# Patient Record
Sex: Female | Born: 1940 | Race: White | Hispanic: No | Smoking: Current every day smoker
Health system: Southern US, Community
[De-identification: ages and names within clinical notes are randomized; demographics above are authoritative.]

## PROBLEM LIST (undated history)

## (undated) DIAGNOSIS — C801 Malignant (primary) neoplasm, unspecified: Secondary | ICD-10-CM

## (undated) DIAGNOSIS — M810 Age-related osteoporosis without current pathological fracture: Secondary | ICD-10-CM

## (undated) DIAGNOSIS — K219 Gastro-esophageal reflux disease without esophagitis: Secondary | ICD-10-CM

## (undated) DIAGNOSIS — F0390 Unspecified dementia without behavioral disturbance: Secondary | ICD-10-CM

## (undated) DIAGNOSIS — I639 Cerebral infarction, unspecified: Secondary | ICD-10-CM

## (undated) HISTORY — PX: OTHER SURGICAL HISTORY: SHX169

## (undated) HISTORY — PX: APPENDECTOMY: SHX54

## (undated) HISTORY — PX: ABDOMINAL HYSTERECTOMY: SHX81

---

## 2013-07-07 ENCOUNTER — Encounter (HOSPITAL_COMMUNITY): Payer: Self-pay | Admitting: Emergency Medicine

## 2013-07-07 ENCOUNTER — Emergency Department (HOSPITAL_COMMUNITY)
Admission: EM | Admit: 2013-07-07 | Discharge: 2013-07-07 | Disposition: A | Discharge status: Home / Self Care | Payer: PRIVATE HEALTH INSURANCE | Attending: Emergency Medicine | Admitting: Emergency Medicine

## 2013-07-07 DIAGNOSIS — Z8719 Personal history of other diseases of the digestive system: Secondary | ICD-10-CM | POA: Insufficient documentation

## 2013-07-07 DIAGNOSIS — R319 Hematuria, unspecified: Secondary | ICD-10-CM | POA: Insufficient documentation

## 2013-07-07 DIAGNOSIS — Z79899 Other long term (current) drug therapy: Secondary | ICD-10-CM | POA: Insufficient documentation

## 2013-07-07 DIAGNOSIS — F0390 Unspecified dementia without behavioral disturbance: Secondary | ICD-10-CM | POA: Insufficient documentation

## 2013-07-07 DIAGNOSIS — F17200 Nicotine dependence, unspecified, uncomplicated: Secondary | ICD-10-CM | POA: Insufficient documentation

## 2013-07-07 DIAGNOSIS — Z8739 Personal history of other diseases of the musculoskeletal system and connective tissue: Secondary | ICD-10-CM | POA: Insufficient documentation

## 2013-07-07 HISTORY — DX: Gastro-esophageal reflux disease without esophagitis: K21.9

## 2013-07-07 HISTORY — DX: Unspecified dementia, unspecified severity, without behavioral disturbance, psychotic disturbance, mood disturbance, and anxiety: F03.90

## 2013-07-07 HISTORY — DX: Age-related osteoporosis without current pathological fracture: M81.0

## 2013-07-07 LAB — CBC
HCT: 37.2 % (ref 36.0–46.0)
HEMOGLOBIN: 12.3 g/dL (ref 12.0–15.0)
MCH: 31.1 pg (ref 26.0–34.0)
MCHC: 33.1 g/dL (ref 30.0–36.0)
MCV: 94.2 fL (ref 78.0–100.0)
Platelets: 310 10*3/uL (ref 150–400)
RBC: 3.95 MIL/uL (ref 3.87–5.11)
RDW: 12.9 % (ref 11.5–15.5)
WBC: 10.8 10*3/uL — ABNORMAL HIGH (ref 4.0–10.5)

## 2013-07-07 LAB — URINALYSIS, ROUTINE W REFLEX MICROSCOPIC
GLUCOSE, UA: 100 mg/dL — AB
LEUKOCYTES UA: NEGATIVE
Nitrite: POSITIVE — AB
Specific Gravity, Urine: >1.03 — ABNORMAL HIGH (ref 1.005–1.030)
UROBILINOGEN UA: 1 mg/dL (ref 0.0–1.0)
pH: 7 (ref 5.0–8.0)

## 2013-07-07 LAB — BASIC METABOLIC PANEL
BUN: 15 mg/dL (ref 6–23)
CO2: 24 mEq/L (ref 19–32)
Calcium: 9.1 mg/dL (ref 8.4–10.5)
Chloride: 102 mEq/L (ref 96–112)
Creatinine, Ser: 0.77 mg/dL (ref 0.50–1.10)
GFR calc non Af Amer: 80 mL/min — ABNORMAL LOW (ref >90)
GLUCOSE: 128 mg/dL — AB (ref 70–99)
POTASSIUM: 3.9 meq/L (ref 3.7–5.3)
SODIUM: 139 meq/L (ref 137–147)

## 2013-07-07 LAB — URINE MICROSCOPIC-ADD ON

## 2013-07-07 MED ORDER — PHENAZOPYRIDINE HCL 200 MG PO TABS: 200.0000 mg | ORAL_TABLET | Freq: Three times a day (TID) | ORAL | Status: DC

## 2013-07-07 NOTE — ED Notes (Signed)
Attempted in and out cath x 2 with no success

## 2013-07-07 NOTE — Discharge Instructions (Signed)
Hematuria, Adult Call Dr. Silvano Rusk office on 07/10/2013 to schedule the next available office appointment. Continue taking the antibiotic prescribed to you yesterday (Tmp-smz). Take the medication prescribed today to help with discomfort when urinating Hematuria is blood in your urine. It can be caused by a bladder infection, kidney infection, prostate infection, kidney stone, or cancer of your urinary tract. Infections can usually be treated with medicine, and a kidney stone usually will pass through your urine. If neither of these is the cause of your hematuria, further workup to find out the reason may be needed. It is very important that you tell your health care provider about any blood you see in your urine, even if the blood stops without treatment or happens without causing pain. Blood in your urine that happens and then stops and then happens again can be a symptom of a very serious condition. Also, pain is not a symptom in the initial stages of many urinary cancers. HOME CARE INSTRUCTIONS   Drink lots of fluid, 3 4 quarts a day. If you have been diagnosed with an infection, cranberry juice is especially recommended, in addition to large amounts of water.  Avoid caffeine, tea, and carbonated beverages, because they tend to irritate the bladder.  Avoid alcohol because it may irritate the prostate.  Only take over-the-counter or prescription medicines for pain, discomfort, or fever as directed by your health care provider.  If you have been diagnosed with a kidney stone, follow your health care provider's instructions regarding straining your urine to catch the stone.  Empty your bladder often. Avoid holding urine for long periods of time.  After a bowel movement, women should cleanse front to back. Use each tissue only once.  Empty your bladder before and after sexual intercourse if you are a female. SEEK MEDICAL CARE IF: You develop back pain, fever, a feeling of sickness in your  stomach (nausea), or vomiting or if your symptoms are not better in 3 days. Return sooner if you are getting worse. SEEK IMMEDIATE MEDICAL CARE IF:   You have a persistent fever, with a temperature of 101.18F (38.8C) or greater.  You develop severe vomiting and are unable to keep the medicine down.  You develop severe back or abdominal pain despite taking your medicines.  You begin passing a large amount of blood or clots in your urine.  You feel extremely weak or faint, or you pass out. MAKE SURE YOU:   Understand these instructions.  Will watch your condition.  Will get help right away if you are not doing well or get worse. Document Released: 02/01/2005 Document Revised: 11/22/2012 Document Reviewed: 10/02/2012 [facility] Patient Information 2014 [place].

## 2013-07-07 NOTE — ED Notes (Signed)
Pt has been being treated for a UTI for 2 months, is passing blood in urine at this time. On Bactrim since yesterday.

## 2013-07-07 NOTE — ED Provider Notes (Signed)
CSN: [id number]     Arrival date & time 07/07/13  1307 History   First MD Initiated Contact with Patient 07/07/13 1343     Chief Complaint  Patient presents with  . Urinary Tract Infection    Level V caveat Dementia. History is obtained from patient's daughter (Consider location/radiation/quality/duration/timing/severity/associated sxs/prior Treatment) HPI Patient is a dysuria and blood mixed with urine for the past 2 months.. She moved here yesterday from [place] to live with her daughter who will be her caretaker. Her daughter states that she's been on multiple courses of antibiotics for urinary tract infections and she's not certain that the patient is taking antibiotics correctly. Patient's primary care physician in [place] recommended that patient have urology followup Past Medical History  Diagnosis Date  . Dementia   . Osteoporosis   . GERD (gastroesophageal reflux disease)    Past Surgical History  Procedure Laterality Date  . Appendectomy    . Abdominal hysterectomy     History reviewed. No pertinent family history. History  Substance Use Topics  . Smoking status: Current Every Day Smoker  . Smokeless tobacco: Not on file  . Alcohol Use: Yes   history of present illness(continued) [place] on urination and urethral meatus OB History   Grav Para Term Preterm Abortions TAB SAB Ect Mult Living                 Review of Systems  Unable to perform ROS: Dementia  Genitourinary: Positive for dysuria and hematuria.      Allergies  Review of patient's allergies indicates no known allergies.  Home Medications   Prior to Admission medications   Medication Sig Start Date End Date Taking? Authorizing Provider  donepezil (ARICEPT) 10 MG tablet Take 10 mg by mouth at bedtime.   Yes Historical Provider, MD  memantine (NAMENDA) 10 MG tablet Take 10 mg by mouth 2 (two) times daily.   Yes Historical Provider, MD   BP 148/62  Pulse 86  Temp(Src) 97.7 F (36.5 C)  (Oral)  Resp 16  Ht 5\' 5"  (1.651 m)  Wt 120 lb (54.432 kg)  BMI 19.97 kg/m2  SpO2 94% Physical Exam  Nursing note and vitals reviewed. Constitutional: She appears well-developed and well-nourished.  HENT:  Head: Normocephalic and atraumatic.  Eyes: Conjunctivae are normal. Pupils are equal, round, and reactive to light.  Neck: Neck supple. No tracheal deviation present. No thyromegaly present.  Cardiovascular: Normal rate and regular rhythm.   No murmur heard. Pulmonary/Chest: Effort normal and breath sounds normal.  Abdominal: Soft. Bowel sounds are normal. She exhibits no distension. There is no tenderness.  Musculoskeletal: Normal range of motion. She exhibits no edema and no tenderness.  Neurological: She is alert. Coordination normal.  Skin: Skin is warm and dry. No rash noted.  Psychiatric: She has a normal mood and affect.   3:10 PM patient resting comfortably. ED Course  Procedures (including critical care time) Labs Review Labs Reviewed - No data to display  Results for orders placed during the hospital encounter of 07/07/13  URINALYSIS, ROUTINE W REFLEX MICROSCOPIC      Result Value Ref Range   Color, Urine RED (*) YELLOW   APPearance CLOUDY (*) CLEAR   Specific Gravity, Urine >1.030 (*) 1.005 - 1.030   pH 7.0  5.0 - 8.0   Glucose, UA 100 (*) NEGATIVE mg/dL   Hgb urine dipstick LARGE (*) NEGATIVE   Bilirubin Urine SMALL (*) NEGATIVE   Ketones, ur TRACE (*) NEGATIVE  mg/dL   Protein, ur >300 (*) NEGATIVE mg/dL   Urobilinogen, UA 1.0  0.0 - 1.0 mg/dL   Nitrite POSITIVE (*) NEGATIVE   Leukocytes, UA NEGATIVE  NEGATIVE  BASIC METABOLIC PANEL      Result Value Ref Range   Sodium 139  137 - 147 mEq/L   Potassium 3.9  3.7 - 5.3 mEq/L   Chloride 102  96 - 112 mEq/L   CO2 24  19 - 32 mEq/L   Glucose, Bld 128 (*) 70 - 99 mg/dL   BUN 15  6 - 23 mg/dL   Creatinine, Ser 0.77  0.50 - 1.10 mg/dL   Calcium 9.1  8.4 - 10.5 mg/dL   GFR calc non Af Amer 80 (*) >90 mL/min    GFR calc Af Amer >90  >90 mL/min  CBC      Result Value Ref Range   WBC 10.8 (*) 4.0 - 10.5 K/uL   RBC 3.95  3.87 - 5.11 MIL/uL   Hemoglobin 12.3  12.0 - 15.0 g/dL   HCT 37.2  36.0 - 46.0 %   MCV 94.2  78.0 - 100.0 fL   MCH 31.1  26.0 - 34.0 pg   MCHC 33.1  30.0 - 36.0 g/dL   RDW 12.9  11.5 - 15.5 %   Platelets 310  150 - 400 K/uL  URINE MICROSCOPIC-ADD ON      Result Value Ref Range   WBC, UA 3-6  <3 WBC/hpf   RBC / HPF TOO NUMEROUS TO COUNT  <3 RBC/hpf   Bacteria, UA MANY (*) RARE   No results found.    Medical Decision Making:patient requests medicine for dysuria.I have explained to patient and daughter need for urologic followup and possiblitity of tumor causing hematuria. Plan continue Bactrim DS. Prescription Pyridium. Referral to Dr. Michela Pitcher. Urine sent for culture Imaging Review No results found.   EKG Interpretation None      MDM   Final diagnoses:  None    diagnosis hematuria     Orlie Dakin, MD 07/07/13 [phone]

## 2013-07-08 LAB — URINE CULTURE
Colony Count: NO GROWTH
Culture: NO GROWTH
SPECIAL REQUESTS: NORMAL

## 2013-07-09 ENCOUNTER — Encounter (HOSPITAL_COMMUNITY): Payer: Self-pay | Admitting: Emergency Medicine

## 2013-07-09 ENCOUNTER — Emergency Department (HOSPITAL_COMMUNITY)
Admission: EM | Admit: 2013-07-09 | Discharge: 2013-07-09 | Disposition: A | Discharge status: Home / Self Care | Payer: PRIVATE HEALTH INSURANCE | Attending: Emergency Medicine | Admitting: Emergency Medicine

## 2013-07-09 ENCOUNTER — Emergency Department (HOSPITAL_COMMUNITY): Payer: PRIVATE HEALTH INSURANCE

## 2013-07-09 DIAGNOSIS — D649 Anemia, unspecified: Secondary | ICD-10-CM | POA: Insufficient documentation

## 2013-07-09 DIAGNOSIS — K219 Gastro-esophageal reflux disease without esophagitis: Secondary | ICD-10-CM | POA: Insufficient documentation

## 2013-07-09 DIAGNOSIS — F0390 Unspecified dementia without behavioral disturbance: Secondary | ICD-10-CM | POA: Insufficient documentation

## 2013-07-09 DIAGNOSIS — Z79899 Other long term (current) drug therapy: Secondary | ICD-10-CM | POA: Insufficient documentation

## 2013-07-09 DIAGNOSIS — M810 Age-related osteoporosis without current pathological fracture: Secondary | ICD-10-CM | POA: Insufficient documentation

## 2013-07-09 DIAGNOSIS — N329 Bladder disorder, unspecified: Secondary | ICD-10-CM | POA: Insufficient documentation

## 2013-07-09 DIAGNOSIS — F17200 Nicotine dependence, unspecified, uncomplicated: Secondary | ICD-10-CM | POA: Insufficient documentation

## 2013-07-09 DIAGNOSIS — N3289 Other specified disorders of bladder: Secondary | ICD-10-CM

## 2013-07-09 DIAGNOSIS — E871 Hypo-osmolality and hyponatremia: Secondary | ICD-10-CM | POA: Insufficient documentation

## 2013-07-09 DIAGNOSIS — R319 Hematuria, unspecified: Secondary | ICD-10-CM | POA: Insufficient documentation

## 2013-07-09 LAB — BASIC METABOLIC PANEL
BUN: 13 mg/dL (ref 6–23)
CHLORIDE: 93 meq/L — AB (ref 96–112)
CO2: 21 meq/L (ref 19–32)
CREATININE: 0.76 mg/dL (ref 0.50–1.10)
Calcium: 8.9 mg/dL (ref 8.4–10.5)
GFR calc Af Amer: >90 mL/min (ref >90)
GFR calc non Af Amer: 80 mL/min — ABNORMAL LOW (ref >90)
Glucose, Bld: 122 mg/dL — ABNORMAL HIGH (ref 70–99)
Potassium: 3.6 mEq/L — ABNORMAL LOW (ref 3.7–5.3)
Sodium: 131 mEq/L — ABNORMAL LOW (ref 137–147)

## 2013-07-09 LAB — CBC WITH DIFFERENTIAL/PLATELET
BASOS ABS: 0 10*3/uL (ref 0.0–0.1)
BASOS PCT: 0 % (ref 0–1)
Eosinophils Absolute: 0.1 10*3/uL (ref 0.0–0.7)
Eosinophils Relative: 1 % (ref 0–5)
HEMATOCRIT: 30.8 % — AB (ref 36.0–46.0)
Hemoglobin: 10.6 g/dL — ABNORMAL LOW (ref 12.0–15.0)
Lymphocytes Relative: 11 % — ABNORMAL LOW (ref 12–46)
Lymphs Abs: 1.2 10*3/uL (ref 0.7–4.0)
MCH: 32.2 pg (ref 26.0–34.0)
MCHC: 34.4 g/dL (ref 30.0–36.0)
MCV: 93.6 fL (ref 78.0–100.0)
Monocytes Absolute: 0.5 10*3/uL (ref 0.1–1.0)
Monocytes Relative: 5 % (ref 3–12)
NEUTROS ABS: 9.2 10*3/uL — AB (ref 1.7–7.7)
NEUTROS PCT: 84 % — AB (ref 43–77)
Platelets: 316 10*3/uL (ref 150–400)
RBC: 3.29 MIL/uL — ABNORMAL LOW (ref 3.87–5.11)
RDW: 12.7 % (ref 11.5–15.5)
WBC: 11 10*3/uL — ABNORMAL HIGH (ref 4.0–10.5)

## 2013-07-09 LAB — URINE MICROSCOPIC-ADD ON

## 2013-07-09 LAB — PROTIME-INR
INR: 1.2 (ref 0.00–1.49)
Prothrombin Time: 14.9 seconds (ref 11.6–15.2)

## 2013-07-09 LAB — APTT: aPTT: 34 seconds (ref 24–37)

## 2013-07-09 MED ORDER — IOHEXOL 300 MG/ML  SOLN
100.0000 mL | Freq: Once | INTRAMUSCULAR | Status: AC | PRN
  Administered 2013-07-09: 100 mL via INTRAVENOUS

## 2013-07-09 MED ORDER — IOHEXOL 300 MG/ML  SOLN
50.0000 mL | Freq: Once | INTRAMUSCULAR | Status: AC | PRN
  Administered 2013-07-09: 50 mL via ORAL

## 2013-07-09 NOTE — ED Provider Notes (Signed)
CSN: [id number]     Arrival date & time 07/09/13  0258 History   First MD Initiated Contact with Patient 07/09/13 [phone]     Chief Complaint  Patient presents with  . Abdominal Pain  . Dysuria     (Consider location/radiation/quality/duration/timing/severity/associated sxs/prior Treatment) HPI Comments: The pt is a 73 y/o female wo takes a baby asa daily - presents with 2nd visit to the ED in 48 hours for difficulty urinating - family member is primary historian secondary to pt's dementia.  She states that over the last few months Ms Cazarez has had hematuria and recurrent UTI's.  She was seen on the 21st at her PMD's office in [place] prior to moving here and told she had clean urine but b/c of the hesitancy that she has had with dysuria, they started bactrim.  She had hematuria all week and when seen 2 days ago had ongoing hematuria (UCx grew no infection).  She has no hx of CA, she has had a hysterectomy.  No fevers at home.  Tonight she awoke her family member telling her that she was unable to urinate at all and has SP pain.  Patient is a 73 y.o. female presenting with dysuria. The history is provided by the patient, a relative and medical records.  Dysuria   Past Medical History  Diagnosis Date  . Dementia   . Osteoporosis   . GERD (gastroesophageal reflux disease)    Past Surgical History  Procedure Laterality Date  . Appendectomy    . Abdominal hysterectomy     No family history on file. History  Substance Use Topics  . Smoking status: Current Every Day Smoker  . Smokeless tobacco: Not on file  . Alcohol Use: Yes   OB History   Grav Para Term Preterm Abortions TAB SAB Ect Mult Living                 Review of Systems  Unable to perform ROS: Dementia  Genitourinary: Positive for dysuria.      Allergies  Review of patient's allergies indicates no known allergies.  Home Medications   Prior to Admission medications   Medication Sig Start Date End Date Taking?  Authorizing Provider  Alendronate Sodium (FOSAMAX PO) Take 1 tablet by mouth once a week.   Yes Historical Provider, MD  Ascorbic Acid 500 MG CHEW Chew 1 tablet by mouth 2 (two) times daily.   Yes Historical Provider, MD  donepezil (ARICEPT) 10 MG tablet Take 10 mg by mouth at bedtime.   Yes Historical Provider, MD  Esomeprazole Magnesium (NEXIUM PO) Take by mouth.   Yes Historical Provider, MD  memantine (NAMENDA) 10 MG tablet Take 10 mg by mouth daily.    Yes Historical Provider, MD  montelukast (SINGULAIR) 10 MG tablet Take 10 mg by mouth daily as needed (allergies).   Yes Historical Provider, MD  sulfamethoxazole-trimethoprim (BACTRIM DS) 800-160 MG per tablet Take 1 tablet by mouth 2 (two) times daily. Started on 07/05/2013 for 10 days   Yes Historical Provider, MD  VITAMIN E PO Take 1 capsule by mouth 3 (three) times daily.   Yes Historical Provider, MD  phenazopyridine (PYRIDIUM) 200 MG tablet Take 1 tablet (200 mg total) by mouth 3 (three) times daily. 07/07/13   Orlie Dakin, MD   BP 142/56  Pulse 69  Temp(Src) 97.8 F (36.6 C) (Oral)  Resp 16  Wt 120 lb (54.432 kg)  SpO2 94% Physical Exam  Nursing note and vitals reviewed. Constitutional:  She appears well-developed and well-nourished. No distress.  HENT:  Head: Normocephalic and atraumatic.  Mouth/Throat: Oropharynx is clear and moist. No oropharyngeal exudate.  Eyes: Conjunctivae and EOM are normal. Pupils are equal, round, and reactive to light. Right eye exhibits no discharge. Left eye exhibits no discharge. No scleral icterus.  Neck: Normal range of motion. Neck supple. No JVD present. No thyromegaly present.  Cardiovascular: Normal rate, regular rhythm, normal heart sounds and intact distal pulses.  Exam reveals no gallop and no friction rub.   No murmur heard. Pulmonary/Chest: Effort normal and breath sounds normal. No respiratory distress. She has no wheezes. She has no rales.  Abdominal: Soft. Bowel sounds are normal.  She exhibits no distension and no mass. There is tenderness (SP ttp and fullness).  Musculoskeletal: Normal range of motion. She exhibits no edema and no tenderness.  Lymphadenopathy:    She has no cervical adenopathy.  Neurological: She is alert. Coordination normal.  Skin: Skin is warm and dry. No rash noted. No erythema.  Psychiatric: She has a normal mood and affect. Her behavior is normal.    ED Course  Procedures (including critical care time) Labs Review Labs Reviewed  BASIC METABOLIC PANEL - Abnormal; Notable for the following:    Sodium 131 (*)    Potassium 3.6 (*)    Chloride 93 (*)    Glucose, Bld 122 (*)    GFR calc non Af Amer 80 (*)    All other components within normal limits  URINE MICROSCOPIC-ADD ON - Abnormal; Notable for the following:    Bacteria, UA MANY (*)    All other components within normal limits  CBC WITH DIFFERENTIAL - Abnormal; Notable for the following:    WBC 11.0 (*)    RBC 3.29 (*)    Hemoglobin 10.6 (*)    HCT 30.8 (*)    Neutrophils Relative % 84 (*)    Neutro Abs 9.2 (*)    Lymphocytes Relative 11 (*)    All other components within normal limits  URINE CULTURE  APTT  PROTIME-INR    Imaging Review Ct Abdomen Pelvis W Contrast  07/09/2013   CLINICAL DATA:  Chronic urinary tract infection, recurrent symptoms. Status post appendectomy.  EXAM: CT ABDOMEN AND PELVIS WITH CONTRAST  TECHNIQUE: Multidetector CT imaging of the abdomen and pelvis was performed using the standard protocol following bolus administration of intravenous contrast.  CONTRAST:  80mL OMNIPAQUE IOHEXOL 300 MG/ML SOLN, 170mL OMNIPAQUE IOHEXOL 300 MG/ML SOLN  COMPARISON:  None.  FINDINGS: Included view of the lung bases demonstrate mild interstitial prominence suggesting scarring without pleural effusions or focal consolidations. . Visualized heart and pericardium are unremarkable.  Mild motion degraded examination.  The spleen, gallbladder, pancreas and adrenal glands are  unremarkable. Patchy non expansile hypodensity in the in the left lobe of the liver likely reflects focal fatty infiltration, the liver is otherwise unremarkable.  The stomach, small and large bowel are normal in course and caliber without inflammatory changes. Diverticulosis. Appendix is not identified, consistent with provided history. No intraperitoneal free fluid nor free air.  Kidneys are orthotopic, demonstrating symmetric enhancement. Moderate right mild left hydroureteronephrosis, with Mild uropathy ileal enhancement. No nephrolithiasis, or renal masses. The unopacified ureters are normal in course and caliber. Delayed imaging through the kidneys demonstrates symmetric prompt excretion to the proximal urinary collecting system. Urinary bladder is well distended, containing a Foley bulb, air, multiple dense masses, including a right mural based at least 4 x 2.6 cm mass.  Aortoiliac vessels are normal in course and caliber with moderate to severe calcific atherosclerosis. Mildly ectatic infrarenal aorta to 2 cm. . No lymphadenopathy by CT size criteria. Internal reproductive organs are unremarkable. The soft tissues and included osseous structures are nonsuspicious. Osteopenia. Grade 1 L5-S1 anterolisthesis with chronic bilateral L5 pars interarticularis defects. Mild chronic appearing L1 compression fracture.  IMPRESSION: Moderate right and mild left hydroureteronephrosis to the level of the urinary bladder, which contains multiple dense masses, some which may reflect hematoma though, partially calcified 4 x 2.6 cm right bladder mass is concerning for neoplasm, recommend cystoscopy.  Diverticulosis without CT findings of acute diverticulitis.   Electronically Signed   By: Elon Alas   On: 07/09/2013 06:21     MDM   Final diagnoses:  Bladder mass  Hematuria  Anemia  Hyponatremia    The pt is pleasantly confused but in obvious mild discomfort in the SP region - likely has urinary retention.   Check renal function, CT to r/o other source of hematuria prior to referral to Urology - and place urinary catheter to empty bladder - will irrigate.  Urinary catheter placed, gross hematuria present, vital signs have remained totally stable without hypotension or tachycardia. The CT scan of the abdomen and pelvis confirmed that the patient has what appears to be a cystic mass which appears calcified and likely has caused some urinary outflow obstruction and/or hematuria. There is some hydroureteronephrosis however at this time the patient's renal function has been preserved, creatinine of 0.7 with a BUN of 13 and a GFR of 80. She has minimal hyponatremia and hypokalemia and her hemoglobin has come down approximately 1.5 g which I suspect is related to the hematuria. The patient's family member states that she will followup with the urologist this week. I have encouraged them to return should there be any increase in the patient's symptoms or blockage of the urinary catheter. They have agreed and are amenable to the plan. At this time I do not think that ongoing antibiotics is appropriate as the hematuria is not likely related to an infectious etiology. Urine culture pending  Meds given in ED:  Medications  iohexol (OMNIPAQUE) 300 MG/ML solution 50 mL (50 mLs Oral Contrast Given 07/09/13 0557)  iohexol (OMNIPAQUE) 300 MG/ML solution 100 mL (100 mLs Intravenous Contrast Given 07/09/13 0557)    New Prescriptions   No medications on file        Johnna Acosta, MD 07/09/13 [phone]

## 2013-07-09 NOTE — ED Notes (Signed)
Leg bag applied to catheter.

## 2013-07-09 NOTE — ED Notes (Signed)
300 cc dark blood w/o clots drained into collection bag. Pt still feels like her bladder is full. Pelvis is firm. Foley irrigated with 30cc NS and returned 500cc easily. No clots noted. Pt feels better. Pelvis soft.

## 2013-07-09 NOTE — ED Notes (Signed)
Pt is being treated for chronic uti's states she is on bactrim but it is not helping, having diff urinating, frequency, urgency

## 2013-07-09 NOTE — Discharge Instructions (Signed)
Your CT scan shows that there is a mass in your bladder. This is likely the source of the blood in your urine. Please see the urologist immediately this week for followup exam and to formulate a plan to further investigate the source of the bleeding.  Please call your doctor for a followup appointment within 24-48 hours. When you talk to your doctor please let them know that you were seen in the emergency department and have them acquire all of your records so that they can discuss the findings with you and formulate a treatment plan to fully care for your new and ongoing problems.  [facility] Primary Care Doctor List    Sinda Du MD. Specialty: Pulmonary Disease Contact information: [address]  [place] [address]  [phone]   Tula Nakayama, MD. Specialty: [facility] Medicine Contact information: [address] [place] [address]  [phone]   Sallee Lange, MD. Specialty: [facility] Medicine Contact information: [place]  [place] [address]  [phone]   Rosita Fire, MD Specialty: Internal Medicine Contact information: [place] [address]  [phone]   Delphina Cahill, MD. Specialty: Internal Medicine Contact information: [place] [address]  [phone]   Marjean Donna, MD. Specialty: Family Medicine Contact information: [place] [address]  [phone]   Leslie Andrea, MD. Specialty: [facility] Medicine Contact information: [place] [address]  [phone]   Asencion Noble, MD. Specialty: Internal Medicine Contact information: [place]  [place] [address]  [phone]

## 2013-07-09 NOTE — ED Notes (Signed)
Drank 1 1/2 bottles of contrast and can't drink any more. CT notified. Pt to go to ct 6am. Pt aware

## 2013-07-09 NOTE — ED Notes (Signed)
Spoke with Elnita Maxwell from [employer]. She reported that the urine sample sent over for UA was unreadable by the machine. A microscopic exam showed the sample was "almost all blood". EDP and pt's RN made aware.

## 2013-07-10 LAB — URINE CULTURE
COLONY COUNT: NO GROWTH
Culture: NO GROWTH
Special Requests: NORMAL

## 2013-07-16 ENCOUNTER — Encounter (HOSPITAL_COMMUNITY): Payer: Self-pay | Admitting: Pharmacy Technician

## 2013-07-19 ENCOUNTER — Emergency Department (HOSPITAL_COMMUNITY)
Admission: EM | Admit: 2013-07-19 | Discharge: 2013-07-19 | Disposition: A | Discharge status: Home / Self Care | Payer: Medicare (Managed Care) | Attending: Emergency Medicine | Admitting: Emergency Medicine

## 2013-07-19 ENCOUNTER — Encounter (HOSPITAL_COMMUNITY): Payer: Self-pay | Admitting: Emergency Medicine

## 2013-07-19 DIAGNOSIS — Z8739 Personal history of other diseases of the musculoskeletal system and connective tissue: Secondary | ICD-10-CM | POA: Insufficient documentation

## 2013-07-19 DIAGNOSIS — N390 Urinary tract infection, site not specified: Secondary | ICD-10-CM | POA: Diagnosis not present

## 2013-07-19 DIAGNOSIS — Z79899 Other long term (current) drug therapy: Secondary | ICD-10-CM | POA: Diagnosis not present

## 2013-07-19 DIAGNOSIS — R062 Wheezing: Secondary | ICD-10-CM | POA: Insufficient documentation

## 2013-07-19 DIAGNOSIS — Z9089 Acquired absence of other organs: Secondary | ICD-10-CM | POA: Diagnosis not present

## 2013-07-19 DIAGNOSIS — F17200 Nicotine dependence, unspecified, uncomplicated: Secondary | ICD-10-CM | POA: Diagnosis not present

## 2013-07-19 DIAGNOSIS — F0390 Unspecified dementia without behavioral disturbance: Secondary | ICD-10-CM | POA: Insufficient documentation

## 2013-07-19 DIAGNOSIS — Z8719 Personal history of other diseases of the digestive system: Secondary | ICD-10-CM | POA: Insufficient documentation

## 2013-07-19 DIAGNOSIS — M7989 Other specified soft tissue disorders: Secondary | ICD-10-CM | POA: Insufficient documentation

## 2013-07-19 DIAGNOSIS — Z90710 Acquired absence of both cervix and uterus: Secondary | ICD-10-CM | POA: Insufficient documentation

## 2013-07-19 DIAGNOSIS — R319 Hematuria, unspecified: Secondary | ICD-10-CM | POA: Diagnosis present

## 2013-07-19 LAB — URINALYSIS, ROUTINE W REFLEX MICROSCOPIC
Glucose, UA: NEGATIVE mg/dL
Ketones, ur: 15 mg/dL — AB
NITRITE: POSITIVE — AB
SPECIFIC GRAVITY, URINE: 1.025 (ref 1.005–1.030)
UROBILINOGEN UA: 2 mg/dL — AB (ref 0.0–1.0)
pH: 7.5 (ref 5.0–8.0)

## 2013-07-19 LAB — CBC WITH DIFFERENTIAL/PLATELET
Basophils Absolute: 0 10*3/uL (ref 0.0–0.1)
Basophils Relative: 0 % (ref 0–1)
Eosinophils Absolute: 0.1 10*3/uL (ref 0.0–0.7)
Eosinophils Relative: 2 % (ref 0–5)
HCT: 29.8 % — ABNORMAL LOW (ref 36.0–46.0)
HEMOGLOBIN: 9.6 g/dL — AB (ref 12.0–15.0)
LYMPHS ABS: 1 10*3/uL (ref 0.7–4.0)
Lymphocytes Relative: 12 % (ref 12–46)
MCH: 31.7 pg (ref 26.0–34.0)
MCHC: 32.2 g/dL (ref 30.0–36.0)
MCV: 98.3 fL (ref 78.0–100.0)
MONOS PCT: 6 % (ref 3–12)
Monocytes Absolute: 0.6 10*3/uL (ref 0.1–1.0)
NEUTROS ABS: 7.3 10*3/uL (ref 1.7–7.7)
NEUTROS PCT: 80 % — AB (ref 43–77)
Platelets: 332 10*3/uL (ref 150–400)
RBC: 3.03 MIL/uL — AB (ref 3.87–5.11)
RDW: 16 % — ABNORMAL HIGH (ref 11.5–15.5)
WBC: 9.1 10*3/uL (ref 4.0–10.5)

## 2013-07-19 LAB — BASIC METABOLIC PANEL
BUN: 16 mg/dL (ref 6–23)
CO2: 25 mEq/L (ref 19–32)
Calcium: 8.8 mg/dL (ref 8.4–10.5)
Chloride: 106 mEq/L (ref 96–112)
Creatinine, Ser: 0.62 mg/dL (ref 0.50–1.10)
GFR calc Af Amer: >90 mL/min (ref >90)
GFR calc non Af Amer: 86 mL/min — ABNORMAL LOW (ref >90)
GLUCOSE: 102 mg/dL — AB (ref 70–99)
POTASSIUM: 3.5 meq/L — AB (ref 3.7–5.3)
Sodium: 144 mEq/L (ref 137–147)

## 2013-07-19 LAB — URINE MICROSCOPIC-ADD ON

## 2013-07-19 MED ORDER — HYDROCODONE-ACETAMINOPHEN 5-325 MG PO TABS: 1.0000 | ORAL_TABLET | Freq: Four times a day (QID) | ORAL | Status: DC | PRN

## 2013-07-19 MED ORDER — OXYCODONE-ACETAMINOPHEN 5-325 MG PO TABS
1.0000 | ORAL_TABLET | Freq: Once | ORAL | Status: AC
  Administered 2013-07-19: 1 via ORAL
  Filled 2013-07-19: qty 1

## 2013-07-19 MED ORDER — CEPHALEXIN 500 MG PO CAPS: 500.0000 mg | ORAL_CAPSULE | Freq: Four times a day (QID) | ORAL | Status: DC

## 2013-07-19 MED ORDER — CEFTRIAXONE SODIUM 1 G IJ SOLR
1.0000 g | Freq: Once | INTRAMUSCULAR | Status: AC
  Administered 2013-07-19: 1 g via INTRAMUSCULAR
  Filled 2013-07-19: qty 10

## 2013-07-19 MED ORDER — LIDOCAINE HCL (PF) 1 % IJ SOLN
INTRAMUSCULAR | Status: AC
  Administered 2013-07-19: 2.1 mL
  Filled 2013-07-19: qty 5

## 2013-07-19 NOTE — Discharge Instructions (Signed)
Follow up with your md next week. °

## 2013-07-19 NOTE — ED Provider Notes (Signed)
CSN: [id number]     Arrival date & time 07/19/13  2005 History   This chart was scribed for Rachel Diego, MD by Martinique Peace, ED Scribe. The patient was seen in [place]. The patient's care was started at 8:49 PM.    Chief Complaint  Patient presents with  . Hematuria      Patient is a 73 y.o. female presenting with abdominal pain. The history is provided by the patient. No language interpreter was used.  Abdominal Pain Pain location:  Suprapubic Pain severity now: Moderate-to-severe. Timing:  Constant Progression:  Worsening Worsened by:  Urination Associated symptoms: hematuria   Associated symptoms: no chest pain, no chills, no cough, no diarrhea, no fatigue, no fever, no nausea and no vomiting    HPI Comments: Crissa Sowder is a 73 y.o. female who presents to the Emergency Department complaining of mild hematuria with associated suprapubic pain that is exacerbated upon unrination. She also reports associated swelling of ankles. She denies fever, chills, nausea or vomiting. She has history of catheter placement with leg bag on May 25th, 2015, that she states has not been functioning properly.    Past Medical History  Diagnosis Date  . Dementia   . Osteoporosis   . GERD (gastroesophageal reflux disease)    Past Surgical History  Procedure Laterality Date  . Appendectomy    . Abdominal hysterectomy     History reviewed. No pertinent family history. History  Substance Use Topics  . Smoking status: Current Every Day Smoker  . Smokeless tobacco: Not on file  . Alcohol Use: Yes   OB History   Grav Para Term Preterm Abortions TAB SAB Ect Mult Living                 Review of Systems  Constitutional: Negative for fever, chills, appetite change and fatigue.  HENT: Negative for congestion, ear discharge and sinus pressure.   Eyes: Negative for discharge.  Respiratory: Negative for cough.   Cardiovascular: Positive for leg swelling (bilateral ankles). Negative for  chest pain.  Gastrointestinal: Positive for abdominal pain. Negative for nausea, vomiting and diarrhea.  Genitourinary: Positive for hematuria. Negative for frequency.  Musculoskeletal: Negative for back pain.  Skin: Negative for rash.  Neurological: Negative for seizures and headaches.  Psychiatric/Behavioral: Negative for hallucinations.      Allergies  Review of patient's allergies indicates no known allergies.  Home Medications   Prior to Admission medications   Medication Sig Start Date End Date Taking? Authorizing Provider  donepezil (ARICEPT) 10 MG tablet Take 10 mg by mouth at bedtime.   Yes Historical Provider, MD  Memantine HCl ER (NAMENDA XR TITRATION PACK) 7 & 14 & 21 &28 MG CP24 Take 14-28 mg by mouth daily. 14 mg until 07/19/13, then 21 mg for 7 days, then 28 mg.   Yes Historical Provider, MD  montelukast (SINGULAIR) 10 MG tablet Take 10 mg by mouth daily as needed (allergies).   Yes Historical Provider, MD   Triage Vitals: BP 137/78  Pulse 85  Temp(Src) 98.1 F (36.7 C) (Oral)  Resp 24  Ht 5\' 5"  (1.651 m)  Wt 120 lb (54.432 kg)  BMI 19.97 kg/m2  SpO2 98%  Physical Exam  Constitutional: She is oriented to person, place, and time. She appears well-developed.  HENT:  Head: Normocephalic.  Eyes: Conjunctivae and EOM are normal. No scleral icterus.  Neck: Neck supple. No thyromegaly present.  Cardiovascular: Normal rate and regular rhythm.  Exam reveals no gallop and  no friction rub.   No murmur heard. Pulmonary/Chest: No stridor. She has wheezes. She has no rales. She exhibits no tenderness.  Wheezing bilaterally.  Abdominal: She exhibits no distension. There is tenderness. There is no rebound.  Mild suprapubic tenderness.  Genitourinary:  Foley cathter in place, not draining  Musculoskeletal: Normal range of motion. She exhibits edema.  1 + edema to bilateral ankles.  Lymphadenopathy:    She has no cervical adenopathy.  Neurological: She is oriented to  person, place, and time. She exhibits normal muscle tone. Coordination normal.  Skin: No rash noted. No erythema.  Psychiatric: She has a normal mood and affect. Her behavior is normal.    ED Course  Procedures (including critical care time) DIAGNOSTIC STUDIES: Oxygen Saturation is 98% on room air, normal by my interpretation.    COORDINATION OF CARE: 8:53 PM- Treatment plan was discussed with patient who verbalizes understanding and agrees.     Labs Review Labs Reviewed  URINALYSIS, ROUTINE W REFLEX MICROSCOPIC  CBC WITH DIFFERENTIAL  BASIC METABOLIC PANEL    Imaging Review No results found.   EKG Interpretation None     Medications - No data to display  MDM   Final diagnoses:  None    uit The chart was scribed for me under my direct supervision.  I personally performed the history, physical, and medical decision making and all procedures in the evaluation of this patient.Rachel Diego, MD 07/19/13 2242

## 2013-07-19 NOTE — ED Notes (Signed)
Pt presents to ED with C/O of foley catheter leaking and burning. Peri care given and catheter irrigated with nacl. Urine sample from catheter obtained. Pt has poor hygiene to indwelling catheter peri care given with incont spray. Pt has a strong smell of urine on a peri pad.

## 2013-07-19 NOTE — ED Notes (Signed)
Indwelling catheter noted to have dark red urine present in leg bag. Bag secured to right leg.

## 2013-07-19 NOTE — ED Notes (Signed)
Pt has catheter  With leg bag,  Since 5/25,  Plans surgery by Dr Michela Pitcher, for ? Bladder cancer.   No N/v.  No fever

## 2013-07-20 ENCOUNTER — Encounter (HOSPITAL_COMMUNITY)
Admission: RE | Admit: 2013-07-20 | Discharge: 2013-07-20 | Disposition: A | Discharge status: Home / Self Care | Payer: PRIVATE HEALTH INSURANCE | Source: Ambulatory Visit | Attending: Urology | Admitting: Urology

## 2013-07-20 ENCOUNTER — Encounter (HOSPITAL_COMMUNITY): Payer: Self-pay

## 2013-07-20 HISTORY — DX: Cerebral infarction, unspecified: I63.9

## 2013-07-20 NOTE — Patient Instructions (Signed)
Rachel Bryant  07/20/2013   Your procedure is scheduled on:   07/26/2013  Report to [facility] at  910  AM.  Call this number if you have problems the morning of surgery: [phone]   Remember:   Do not eat food or drink liquids after midnight.   Take these medicines the morning of surgery with A SIP OF WATER:  Aricept, namenda, singulair   Do not wear jewelry, make-up or nail polish.  Do not wear lotions, powders, or perfumes.   Do not shave 48 hours prior to surgery. Men may shave face and neck.  Do not bring valuables to the hospital.  [name] is not responsible for any belongings or valuables.               Contacts, dentures or bridgework may not be worn into surgery.  Leave suitcase in the car. After surgery it may be brought to your room.  For patients admitted to the hospital, discharge time is determined by your treatment team.               Patients discharged the day of surgery will not be allowed to drive home.  Name and phone number of your driver: family  Special Instructions: Shower using CHG 2 nights before surgery and the night before surgery.  If you shower the day of surgery use CHG.  Use special wash - you have one bottle of CHG for all showers.  You should use approximately 1/3 of the bottle for each shower.   Please read over the following fact sheets that you were given: Pain Booklet, Coughing and Deep Breathing, Surgical Site Infection Prevention, Anesthesia Post-op Instructions and Care and Recovery After Surgery Transurethral Resection, Bladder Tumor A cancerous growth (tumor) can develop on the inside wall of the bladder. The bladder is the organ that holds urine. One way to remove the tumor is a procedure called a transurethral resection. The tumor is removed (resected) through the tube that carries urine from the bladder out of the body (urethra). No cuts (incisions) are made in the skin. Instead, the procedure is done through a thin telescope,  called a resectoscope. Attached to it is a light and usually a tiny camera. The resectoscope is put into the urethra. In men, the urethra opens at the end of the penis. In women, it opens just above the vagina.  A transurethral resection is usually used to remove tumors that have not gotten too big or too deep. These are called Stage 0, Stage 1 or Stage 2 bladder cancers. LET YOUR CAREGIVER KNOW ABOUT:  On the day of the procedure, your caregivers will need to know the last time you had anything to eat or drink. This includes water, gum, and candy. In advance, make sure they know about:   Any allergies.  All medications you are taking, including:  Herbs, eyedrops, over-the-counter medications and creams.  Blood thinners (anticoagulants), aspirin or other drugs that could affect blood clotting.  Use of steroids (by mouth or as creams).  Previous problems with anesthetics, including local anesthetics.  Possibility of pregnancy, if this applies.  Any history of blood clots.  Any history of bleeding or other blood problems.  Previous surgery.  Smoking history.  Any recent symptoms of colds or infections.  Other health problems. RISKS AND COMPLICATIONS This is usually a safe procedure. Every procedure has risks, though. For a transurethral resection, they include:  Infection. Antibiotic medication would need  to be taken.  Bleeding.  Light bleeding may last for several days after the procedure.  If bleeding continues or is heavy, the bladder may need rinsing. Or, a new catheter might be put in for awhile.  Sometimes bed rest is needed.  Urination problems.  Pain and burning can occur when urinating. This usually goes away in a few days.  Scarring from the procedure can block the flow of urine.  Bladder damage.  It can be punctured or torn during removal of the tumor. If this happens, a catheter might be needed for longer. Antibiotics would be taken while the bladder  heals.  Urine can leak through the hole or tear into the abdomen. If this happens, surgery may be needed to repair the bladder. BEFORE THE PROCEDURE   A medical evaluation will be done. This may include:  A physical examination.  Urine test. This is to make sure you do not have a urinary tract infection.  Blood tests.  A test that checks the heart's rhythm (electrocardiogram).  Talking with an anesthesiologist. This is the person who will be in charge of the medication (anesthesia) to keep you from feeling pain during the transurethral resection. You might be asleep during the procedure (general anesthesia) or numb from the waist down, but awake during the procedure (spinal anesthesia). Ask your surgeon what to expect.  The person who is having a transurethral resection needs to give what is called informed consent. This requires signing a legal paper that gives permission for the procedure. To give informed consent:  You must understand how the procedure is done and why.  You must be told all the risks and benefits of the procedure.  You must sign the consent. Sometimes a legal guardian can do this.  Signing should be witnessed by a healthcare professional.  The day before the surgery, eat only a light dinner. Then, do not eat or drink anything for at least 8 hours before the surgery. Ask your caregiver if it is OK to take any needed medicines with a sip of water.  Arrive at least an hour before the surgery or whenever your surgeon recommends. This will give you time to check in and fill out any needed paperwork. PROCEDURE  The preparation:  You will change into a hospital gown.  A needle will be inserted in your arm. This is an intravenous access tube (IV). Medication will be able to flow directly into your body through this needle.  Small monitors will be put on your body. They are used to check your heart, blood pressure, and oxygen level.  You might be given medication  that will help you relax (sedative).  You will be given a general anesthetic or spinal anesthesia.  The procedure:  Once you are asleep or numb from the waist down, your legs will be placed in stirrups.  The resectoscope will be passed through the urethra into the bladder.  Fluid will be passed through the resectoscope. This will fill the bladder with water.  The surgeon will examine the bladder through the scope. If the scope has a camera, it can take pictures from inside the bladder. They can be projected onto a TV screen.  The surgeon will use various tools to remove the tumor in small pieces. Sometimes a laser (a beam of light energy) is used. Other tools may use electric current.  A tube (catheter) will often be placed so that urine can drain into a bag outside the body. This process  helps stop bleeding. This tube keeps blood clots from blocking the urethra.  The procedure usually takes 30 to 45 minutes. AFTER THE PROCEDURE   You will stay in a recovery area until the anesthesia has worn off. Your blood pressure and pulse will be checked every so often. Then you will be taken to a hospital room.  You may continue to get fluids through the IV for awhile.  Some pain is normal. The catheter might be uncomfortable. Pain is usually not severe. If it is, ask for pain medicine.  Your urine may look bloody after a transurethral resection. This is normal.  If bleeding is heavy, a hospital caregiver may rinse out the bladder (irrigation) through the catheter.  Once the urine is clear, the catheter will be taken out.  You will need to stay in the hospital until you can urinate on your own.  Most people stay in the hospital for up to 4 days. PROGNOSIS   Transurethral resection is considered the best way to treat bladder tumors that are not too far along. For most people, the treatment is successful. Sometimes, though, more treatment is needed.  Bladder cancers can come back even  after a successful procedure. Because of this, be sure to have a checkup with your caregiver every 3 to 6 months. If everything is OK for 3 years, you can reduce the checkups to once a year. Document Released: 11/28/2008 Document Revised: 04/26/2011 Document Reviewed: 11/28/2008 [facility] Patient Information 2014 [place]. PATIENT INSTRUCTIONS POST-ANESTHESIA  IMMEDIATELY FOLLOWING SURGERY:  Do not drive or operate machinery for the first twenty four hours after surgery.  Do not make any important decisions for twenty four hours after surgery or while taking narcotic pain medications or sedatives.  If you develop intractable nausea and vomiting or a severe headache please notify your doctor immediately.  FOLLOW-UP:  Please make an appointment with your surgeon as instructed. You do not need to follow up with anesthesia unless specifically instructed to do so.  WOUND CARE INSTRUCTIONS (if applicable):  Keep a dry clean dressing on the anesthesia/puncture wound site if there is drainage.  Once the wound has quit draining you may leave it open to air.  Generally you should leave the bandage intact for twenty four hours unless there is drainage.  If the epidural site drains for more than 36-48 hours please call the anesthesia department.  QUESTIONS?:  Please feel free to call your physician or the hospital operator if you have any questions, and they will be happy to assist you.

## 2013-07-23 NOTE — Pre-Procedure Instructions (Signed)
Dr Patsey Berthold shown labs. Wants patient type and screened.

## 2013-07-24 NOTE — Care Management Note (Signed)
    Page 1 of 1   07/20/2013     2:44:37 PM CARE MANAGEMENT NOTE 07/20/2013  Patient:  Rachel Bryant, Rachel Bryant   Account Number:  [id number]  Date Initiated:  07/20/2013  Documentation initiated by:  Claretha Cooper  Subjective/Objective Assessment:   Called by Short Stay. Pt is here for her presurgical interview and states she needs DME, shower bench prior to surgery.     Action/Plan:   If pt is not inpt or in ED, the pt will need to go to PCP or [employer] to get an order for DME. Advised that shower equipment is normally not covered by insurance and [employer] or Suzie Portela would have these items.   Anticipated DC Date:     Anticipated DC Plan:           Choice offered to / List presented to:             Status of service:   Medicare Important Message given?   (If response is "NO", the following Medicare IM given date fields will be blank) Date Medicare IM given:   Date Additional Medicare IM given:    Discharge Disposition:    Per UR Regulation:    If discussed at Long Length of Stay Meetings, dates discussed:    Comments:  07/20/13 Claretha Cooper RN CM

## 2013-07-26 ENCOUNTER — Encounter (HOSPITAL_COMMUNITY): Admission: RE | Payer: Self-pay | Source: Ambulatory Visit

## 2013-07-26 ENCOUNTER — Ambulatory Visit (HOSPITAL_COMMUNITY): Admission: RE | Admit: 2013-07-26 | Payer: 59 | Source: Ambulatory Visit | Admitting: Urology

## 2013-07-26 SURGERY — TURBT (TRANSURETHRAL RESECTION OF BLADDER TUMOR)
Anesthesia: Choice

## 2013-10-25 ENCOUNTER — Emergency Department (HOSPITAL_COMMUNITY): Payer: Medicare Other

## 2013-10-25 ENCOUNTER — Emergency Department (HOSPITAL_COMMUNITY)
Admission: EM | Admit: 2013-10-25 | Discharge: 2013-10-25 | Disposition: A | Discharge status: Home / Self Care | Payer: Medicare Other | Attending: Emergency Medicine | Admitting: Emergency Medicine

## 2013-10-25 ENCOUNTER — Encounter (HOSPITAL_COMMUNITY): Payer: Self-pay | Admitting: Emergency Medicine

## 2013-10-25 DIAGNOSIS — Z8673 Personal history of transient ischemic attack (TIA), and cerebral infarction without residual deficits: Secondary | ICD-10-CM | POA: Diagnosis not present

## 2013-10-25 DIAGNOSIS — Z9089 Acquired absence of other organs: Secondary | ICD-10-CM | POA: Diagnosis not present

## 2013-10-25 DIAGNOSIS — Z936 Other artificial openings of urinary tract status: Secondary | ICD-10-CM | POA: Diagnosis not present

## 2013-10-25 DIAGNOSIS — R109 Unspecified abdominal pain: Secondary | ICD-10-CM | POA: Diagnosis present

## 2013-10-25 DIAGNOSIS — Z8719 Personal history of other diseases of the digestive system: Secondary | ICD-10-CM | POA: Insufficient documentation

## 2013-10-25 DIAGNOSIS — F17200 Nicotine dependence, unspecified, uncomplicated: Secondary | ICD-10-CM | POA: Diagnosis not present

## 2013-10-25 DIAGNOSIS — F0390 Unspecified dementia without behavioral disturbance: Secondary | ICD-10-CM | POA: Insufficient documentation

## 2013-10-25 DIAGNOSIS — C7919 Secondary malignant neoplasm of other urinary organs: Secondary | ICD-10-CM | POA: Insufficient documentation

## 2013-10-25 DIAGNOSIS — R52 Pain, unspecified: Secondary | ICD-10-CM

## 2013-10-25 DIAGNOSIS — Z792 Long term (current) use of antibiotics: Secondary | ICD-10-CM | POA: Diagnosis not present

## 2013-10-25 DIAGNOSIS — C7911 Secondary malignant neoplasm of bladder: Secondary | ICD-10-CM

## 2013-10-25 DIAGNOSIS — Z90710 Acquired absence of both cervix and uterus: Secondary | ICD-10-CM | POA: Insufficient documentation

## 2013-10-25 DIAGNOSIS — Z8739 Personal history of other diseases of the musculoskeletal system and connective tissue: Secondary | ICD-10-CM | POA: Insufficient documentation

## 2013-10-25 DIAGNOSIS — Z79899 Other long term (current) drug therapy: Secondary | ICD-10-CM | POA: Diagnosis not present

## 2013-10-25 DIAGNOSIS — IMO0002 Reserved for concepts with insufficient information to code with codable children: Secondary | ICD-10-CM

## 2013-10-25 HISTORY — DX: Malignant (primary) neoplasm, unspecified: C80.1

## 2013-10-25 LAB — COMPREHENSIVE METABOLIC PANEL
ALBUMIN: 2.9 g/dL — AB (ref 3.5–5.2)
ALK PHOS: 93 U/L (ref 39–117)
ALT: 20 U/L (ref 0–35)
ANION GAP: 13 (ref 5–15)
AST: 27 U/L (ref 0–37)
BILIRUBIN TOTAL: 0.5 mg/dL (ref 0.3–1.2)
BUN: 21 mg/dL (ref 6–23)
CHLORIDE: 99 meq/L (ref 96–112)
CO2: 25 mEq/L (ref 19–32)
Calcium: 9.2 mg/dL (ref 8.4–10.5)
Creatinine, Ser: 0.61 mg/dL (ref 0.50–1.10)
GFR calc Af Amer: >90 mL/min (ref >90)
GFR calc non Af Amer: 87 mL/min — ABNORMAL LOW (ref >90)
GLUCOSE: 108 mg/dL — AB (ref 70–99)
POTASSIUM: 4.3 meq/L (ref 3.7–5.3)
Sodium: 137 mEq/L (ref 137–147)
Total Protein: 6.6 g/dL (ref 6.0–8.3)

## 2013-10-25 LAB — URINE MICROSCOPIC-ADD ON

## 2013-10-25 LAB — CBC WITH DIFFERENTIAL/PLATELET
BASOS PCT: 0 % (ref 0–1)
Basophils Absolute: 0 10*3/uL (ref 0.0–0.1)
Eosinophils Absolute: 0.1 10*3/uL (ref 0.0–0.7)
Eosinophils Relative: 1 % (ref 0–5)
HCT: 35.1 % — ABNORMAL LOW (ref 36.0–46.0)
Hemoglobin: 11.7 g/dL — ABNORMAL LOW (ref 12.0–15.0)
LYMPHS ABS: 1.2 10*3/uL (ref 0.7–4.0)
Lymphocytes Relative: 9 % — ABNORMAL LOW (ref 12–46)
MCH: 28.5 pg (ref 26.0–34.0)
MCHC: 33.3 g/dL (ref 30.0–36.0)
MCV: 85.6 fL (ref 78.0–100.0)
Monocytes Absolute: 0.7 10*3/uL (ref 0.1–1.0)
Monocytes Relative: 6 % (ref 3–12)
NEUTROS ABS: 11.2 10*3/uL — AB (ref 1.7–7.7)
NEUTROS PCT: 84 % — AB (ref 43–77)
Platelets: 518 10*3/uL — ABNORMAL HIGH (ref 150–400)
RBC: 4.1 MIL/uL (ref 3.87–5.11)
RDW: 16.8 % — ABNORMAL HIGH (ref 11.5–15.5)
WBC: 13.3 10*3/uL — AB (ref 4.0–10.5)

## 2013-10-25 LAB — URINALYSIS, ROUTINE W REFLEX MICROSCOPIC
Bilirubin Urine: NEGATIVE
GLUCOSE, UA: NEGATIVE mg/dL
Ketones, ur: NEGATIVE mg/dL
Nitrite: NEGATIVE
PH: 7 (ref 5.0–8.0)
Protein, ur: 100 mg/dL — AB
Specific Gravity, Urine: 1.01 (ref 1.005–1.030)
Urobilinogen, UA: 0.2 mg/dL (ref 0.0–1.0)

## 2013-10-25 LAB — PROTIME-INR
INR: 1.09 (ref 0.00–1.49)
Prothrombin Time: 14.1 seconds (ref 11.6–15.2)

## 2013-10-25 MED ORDER — OXYCODONE HCL ER 10 MG PO T12A
20.0000 mg | EXTENDED_RELEASE_TABLET | Freq: Two times a day (BID) | ORAL | Status: DC
  Administered 2013-10-25: 20 mg via ORAL
  Filled 2013-10-25: qty 2

## 2013-10-25 MED ORDER — DOCUSATE SODIUM 100 MG PO CAPS: 100.0000 mg | ORAL_CAPSULE | Freq: Two times a day (BID) | ORAL | Status: DC

## 2013-10-25 MED ORDER — CEPHALEXIN 500 MG PO CAPS: 500.0000 mg | ORAL_CAPSULE | Freq: Four times a day (QID) | ORAL | Status: AC

## 2013-10-25 MED ORDER — CEPHALEXIN 500 MG PO CAPS
500.0000 mg | ORAL_CAPSULE | Freq: Once | ORAL | Status: AC
  Administered 2013-10-25: 500 mg via ORAL
  Filled 2013-10-25: qty 1

## 2013-10-25 MED ORDER — OXYCODONE HCL ER 15 MG PO T12A: 15.0000 mg | EXTENDED_RELEASE_TABLET | Freq: Two times a day (BID) | ORAL | Status: DC

## 2013-10-25 NOTE — ED Provider Notes (Addendum)
CSN: [id number]     Arrival date & time 10/25/13  1215 History  This chart was scribed for Tanna Furry, MD by Ludger Nutting, ED Scribe. This patient was seen in room APA07/APA07 and the patient's care was started 1:34 PM.    Chief Complaint  Patient presents with  . Abdominal Pain    The history is provided by the patient. No language interpreter was used.   LEVEL 5 CAVEAT - Dementia  HPI Comments: Rachel Bryant is a 73 y.o. female with past medical history of dementia, CA brought in by ambulance, who presents to the Emergency Department but unable to provide a chief complaint. Patient was discharged from [facility] for a right nephrostomy tube placement about 4-5 days ago. Patient is unable to state where she has been living since she was released from the hospital. She denies fever, nausea, vomiting, muscular pain, SOB.   Past Medical History  Diagnosis Date  . Osteoporosis   . GERD (gastroesophageal reflux disease)   . Dementia   . Stroke unsure  . Cancer    Past Surgical History  Procedure Laterality Date  . Appendectomy    . Abdominal hysterectomy    . Nephrostomy tube     History reviewed. No pertinent family history. History  Substance Use Topics  . Smoking status: Current Every Day Smoker  . Smokeless tobacco: Not on file  . Alcohol Use: No   OB History   Grav Para Term Preterm Abortions TAB SAB Ect Mult Living                 Review of Systems  Unable to perform ROS: Dementia      Allergies  Review of patient's allergies indicates no known allergies.  Home Medications   Prior to Admission medications   Medication Sig Start Date End Date Taking? Authorizing Provider  donepezil (ARICEPT) 10 MG tablet Take 10 mg by mouth at bedtime.   Yes Historical Provider, MD  Memantine HCl ER (NAMENDA XR) 28 MG CP24 Take 28 mg by mouth daily.   Yes Historical Provider, MD  ondansetron (ZOFRAN) 4 MG tablet Take 4 mg by mouth every 8 (eight) hours as needed for nausea or  vomiting.   Yes Historical Provider, MD  oxybutynin (DITROPAN-XL) 10 MG 24 hr tablet Take 10 mg by mouth at bedtime.   Yes Historical Provider, MD  oxyCODONE (OXY IR/ROXICODONE) 5 MG immediate release tablet Take 5 mg by mouth every 4 (four) hours as needed for severe pain (pain).   Yes Historical Provider, MD  cephALEXin (KEFLEX) 500 MG capsule Take 1 capsule (500 mg total) by mouth 4 (four) times daily. 10/25/13   Tanna Furry, MD   BP 154/62  Pulse 79  Temp(Src) 97.8 F (36.6 C) (Oral)  Resp 18  Wt 120 lb (54.432 kg)  SpO2 96% Physical Exam  Nursing note and vitals reviewed. Constitutional: She is oriented to person, place, and time. She appears well-developed and well-nourished. No distress.  HENT:  Head: Normocephalic.  Eyes: Conjunctivae are normal. Pupils are equal, round, and reactive to light. No scleral icterus.  Neck: Normal range of motion. Neck supple. No thyromegaly present.  Cardiovascular: Normal rate, regular rhythm and normal heart sounds.  Exam reveals no gallop and no friction rub.   No murmur heard. Pulmonary/Chest: Effort normal and breath sounds normal. No respiratory distress. She has no wheezes. She has no rales.  Abdominal: Soft. Bowel sounds are normal. She exhibits no distension. There is no tenderness.  There is no rebound.  Genitourinary:  Foley bag with orange colored urine  Musculoskeletal: Normal range of motion.  Neurological: She is alert and oriented to person, place, and time.  Dementia   Skin: Skin is warm and dry. No rash noted.  Psychiatric: She has a normal mood and affect. Her behavior is normal.    ED Course  Procedures (including critical care time)  DIAGNOSTIC STUDIES: Oxygen Saturation is 96% on RA, adequate by my interpretation.    COORDINATION OF CARE: 1:38 PM Discussed treatment plan with pt at bedside and pt agreed to plan.   Labs Review Labs Reviewed  CBC WITH DIFFERENTIAL - Abnormal; Notable for the following:    WBC 13.3 (*)     Hemoglobin 11.7 (*)    HCT 35.1 (*)    RDW 16.8 (*)    Platelets 518 (*)    Neutrophils Relative % 84 (*)    Neutro Abs 11.2 (*)    Lymphocytes Relative 9 (*)    All other components within normal limits  COMPREHENSIVE METABOLIC PANEL - Abnormal; Notable for the following:    Glucose, Bld 108 (*)    Albumin 2.9 (*)    GFR calc non Af Amer 87 (*)    All other components within normal limits  URINALYSIS, ROUTINE W REFLEX MICROSCOPIC - Abnormal; Notable for the following:    Hgb urine dipstick LARGE (*)    Protein, ur 100 (*)    Leukocytes, UA SMALL (*)    All other components within normal limits  URINE MICROSCOPIC-ADD ON - Abnormal; Notable for the following:    Squamous Epithelial / LPF FEW (*)    Bacteria, UA FEW (*)    All other components within normal limits  URINE CULTURE  PROTIME-INR    Imaging Review No results found.   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 I was also able to access her records and daughter brings with her. 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 She is getting home health care nursing. Daughter reported to them that the patient cries with pain at night. The daughter had already set up a consult with hospice for this afternoon. The home health care nurse apparently called 911 to have the patient brought to the hospital for "pain management".  Patient has no complaints  of pain here. Has a leukocytosis of 13,000 and does have cells in her urine. This is not surprising considering the presence of a nephrostomy tube. The nephrostomy tube site appears quite intact, and without sign of local infection drainage or erythema. Urine has been cultured. I think the patient is appropriate for discharge home to continue her home health care needs. We will place her on Keflex until urine culture result is available. She was given a single dose of 15 mg of OxyContin here for pain control tonight. He'll be reevaluated at home tonight, and in the morning  by hospice at home.   Tanna Furry, MD 10/25/13 [place], MD 10/25/13 [place], MD 11/09/13 [phone]

## 2013-10-25 NOTE — ED Notes (Signed)
[facility] called for patient discharge transfer.

## 2013-10-25 NOTE — Discharge Instructions (Signed)
Continue her pain medicine regimen at home. Hospice will evaluate patient for further pain control needs. Continue home health care nursing.  Bladder Cancer Bladder cancer is an abnormal growth of tissue in your bladder. Your bladder is the balloon-like sac in your pelvis. It collects and stores urine that comes from the kidneys through the ureters. The bladder wall is made of layers. If cancer spreads into these layers and through the wall of the bladder, it becomes more difficult to treat.  There are four stages of bladder cancer:  Stage I. Cancer at this stage occurs in the bladder's inner lining but has not invaded the muscular bladder wall.  Stage II. At this stage, cancer has invaded the bladder wall but is still confined to the bladder.  Stage III. By this stage, the cancer cells have spread through the bladder wall to surrounding tissue. They may also have spread to the prostate in men or the uterus or vagina in women.  Stage IV. By this stage, cancer cells may have spread to the lymph nodes and other organs, such as your lungs, bones, or liver. RISK FACTORS Although the cause of bladder cancer is not known, the following risk factors can increase your chances of getting bladder cancer:   Smoking.   Occupational exposures, such as rubber, leather, textile, dyes, chemicals, and paint.  Being white.  Age.   Being female.   Having chronic bladder inflammation.   Having a bladder cancer history.   Having a family history of bladder cancer (heredity).   Having had chemotherapy or radiation therapy to the pelvis.   Being exposed to arsenic.  SYMPTOMS   Blood in the urine.   Pain with urination.   Frequent bladder or urine infections.  Increase in urgency and frequency of urination. DIAGNOSIS  Your health care provider may suspect bladder cancer based on your description of urinary symptoms or based on the finding of blood or infection in the urine  (especially if this has recurred several times). Other tests or procedures that may be performed include:   A narrow tube being inserted into your bladder through your urethra (cystoscopy) in order to view the lining of your bladder for tumors.   A biopsy to sample the tumor to see if cancer is present.  If cancer is present, it will then be staged to determine its severity and extent. It is important to know how deeply into the bladder wall the cancer has grown and whether the cancer has spread to any other parts of your body. Staging may require blood tests or special scans such as a CT scan, MRI, bone scan, or chest X-ray.  TREATMENT  Once your cancer has been diagnosed and staged, you should discuss a treatment plan with your health care provider. Based on the stage of the cancer, one treatment or a combination of treatments may be recommended. The most common forms of treatment are:   Surgery. Procedures that may be done include transurethral resection and cystectomy.  Radiation therapy. This is infrequently used to treat bladder cancer.   Chemotherapy. During this treatment, drugs are used to kill cancer cells.  Immunotherapy. This is usually administered directly into the bladder. HOME CARE INSTRUCTIONS  Take medicines only as directed by your health care provider.   Maintain a healthy diet.   Consider joining a support group. This may help you learn to cope with the stress of having bladder cancer.   Seek advice to help you manage treatment side effects.  Keep all follow-up visits as directed by your health care provider.   Inform your cancer specialist if you are admitted to the hospital.  [place] IF:  There is blood in your urine.  You have symptoms of a urinary tract infection. These include:  Tiredness.  Shakiness.  Weakness.  Muscle aches.  Abdominal pain.  Frequent and intense urge to urinate (in young women).  Burning feeling in the  bladder or urethra during urination (in young women). SEEK IMMEDIATE MEDICAL CARE IF:  You are unable to urinate. Document Released: 02/04/2003 Document Revised: 06/18/2013 Document Reviewed: 07/25/2012 [facility] Patient Information 2015 [place], [place]. This information is not intended to replace advice given to you by your health care provider. Make sure you discuss any questions you have with your health care provider.  Metastatic Cancer, Questions and Answers KEY POINTS  Cancer happens when cells become abnormal and grow without control.  Where the cancer started is called the primary cancer or the primary tumor.  Metastatic cancer happens when cancer cells spread from the place where it started to other parts of the body.  When cancer spreads, the metastatic cancer keeps the same type of cells and the same name as the primary tumor.  The most common sites of metastasis are the lungs, bones, liver, and brain.  Treatment for metastatic cancer usually depends on the type of cancer. It also depends on the size and location of the metastasis. WHAT IS CANCER?   Cancer is a group of many related diseases. All cancers begin in cells. Cells are the building blocks that make up tissues. Cancer that arises from organs and solid tissues is called a solid tumor. Cancer that begins in blood cells is called leukemia, multiple myeloma, or lymphoma.  Normally, cells grow and divide to form new cells as the body needs them. When cells grow old and die, new cells take their place. Sometimes this orderly process goes wrong. New cells form when the body does not need them. Old cells do not die when they should.  The extra cells form a mass of tissue. This is called a growth or tumor. Tumors can be either not cancerous (benign) or cancerous (malignant). Benign tumors do not spread to other parts of the body. They are rarely a threat to life. Malignant tumors can spread (metastasize) and may be life  threatening. WHAT IS PRIMARY CANCER?  Cancer can begin in any organ or tissue of the body. The original tumor is called the primary cancer or primary tumor. It is usually named for the part of the body or the type of cell in which it begins. WHAT IS METASTASIS, AND HOW DOES IT HAPPEN?   Metastasis means the spread of cancer. Cancer cells can break away from a primary tumor and enter the bloodstream or lymphatic system. This is the system that produces, stores, and carries the cells that fight infections. That is how cancer cells spread to other parts of the body.  When cancer cells spread and form a new tumor in a different organ, the new tumor is a metastatic tumor. The cells in the metastatic tumor come from the original tumor. For example, if breast cancer spreads to the lungs, the metastatic tumor in the lung is made up of cancerous breast cells. It is not made of lung cells. In this case, the disease in the lungs is metastatic breast cancer (not lung cancer). Under a microscope, metastatic breast cancer cells generally look the same as the  cancer cells in the breast. WHERE DOES CANCER SPREAD?   Cancer cells can spread to almost any part of the body. Cancer cells frequently spread to lymph nodes (rounded masses of lymphatic tissue) near the primary tumor (regional lymph nodes). This is called lymph node involvement or regional disease. Cancer that spreads to other organs or to lymph nodes far from the primary tumor is called metastatic disease. Caregivers sometimes also call this distant disease.  The most common sites of metastasis from solid tumors are the lungs, bones, liver, and brain. Some cancers tend to spread to certain parts of the body. For example, lung cancer often metastasizes to the brain or bones. Colon cancer often spreads to the liver. Prostate cancer tends to spread to the bones. Breast cancer commonly spreads to the bones, lungs, liver, or brain. But each of these cancers can  spread to other parts of the body as well.  Because blood cells travel throughout the body, leukemia, multiple myeloma, and lymphoma cells are usually not localized when the cancer is diagnosed. Tumor cells may be found in the blood, several lymph nodes, or other parts of the body such as the liver or bones. This type of spread is not referred to as metastasis. ARE THERE SYMPTOMS OF METASTATIC CANCER?   Some people with metastatic cancer do not have symptoms. Their metastases are found by X-rays and other tests performed for other reasons.  When symptoms of metastatic cancer occur, the type and frequency of the symptoms will depend on the size and location of the metastasis. For example, cancer that spreads to the bones is likely to cause pain and can lead to bone fractures. Cancer that spreads to the brain can cause a variety of symptoms. These include headaches, seizures, and unsteadiness. Shortness of breath may be a sign of lung involvement. Abdominal swelling or yellowing of the skin (jaundice) can indicate that cancer has spread to the liver.  Sometimes a person's primary cancer is discovered only after the metastatic tumor causes symptoms. For example, a man whose prostate cancer has spread to the bones in his pelvis may have lower back pain (caused by the cancer in his bones) before he experiences any symptoms from the primary tumor in his prostate. HOW DOES THE CAREGIVER KNOW WHETHER A CANCER IS PRIMARY OR A METASTATIC TUMOR?  To determine whether a tumor is primary or metastatic, the tumor will be examined under a microscope. In general, cancer cells look like abnormal versions of cells in the tissue where the cancer began. Using specialized diagnostic tests, a trained person is often able to tell where the cancer cells came from. Markers or antigens found in or on the cancer cells can indicate the primary site of the cancer.  Metastatic cancers may be found before or at the same time as the  primary tumor, or months or years later. When a new tumor is found in a patient who has been treated for cancer in the past, it is more often a metastasis than another primary tumor. IS IT POSSIBLE TO HAVE A METASTATIC TUMOR WITHOUT HAVING A PRIMARY CANCER?  No. A metastatic tumor always starts from cancer cells in another part of the body. In most cases, when a metastatic tumor is found first, the primary tumor can be found. The search for the primary tumor may involve lab tests, X-rays, and other procedures. However, in a small number of cases, a metastatic tumor is diagnosed but the primary tumor cannot be found, in spite  of extensive tests. The tumor is metastatic because the cells are not like those in the organ or tissue in which the tumor is found. The primary tumor is called unknown or hidden (occult). The patient is said to have cancer of unknown primary origin (CUP). Because diagnostic techniques are constantly improving, the number of cases of CUP is going down.  WHAT TREATMENTS ARE USED FOR METASTATIC CANCER?   When cancer has metastasized, it may be treated with:  Chemotherapy.  Radiation therapy.  Biological therapy.  Hormone therapy.  Surgery.  Cryosurgery.  A combination of these.  The choice of treatment generally depends on the:  Type of primary cancer.  Size and location of the metastasis.  Patient's age and general health.  Types of treatments the patient has had in the past. In patients with CUP, it is possible to treat the disease even though the primary tumor has not been located. The goal of treatment may be to control the cancer, or to relieve symptoms or side effects of treatment. ARE NEW TREATMENTS FOR METASTATIC CANCER BEING DEVELOPED?  Yes, many new cancer treatments are under study. To develop new treatments, the [employer] sponsors clinical trials (research studies) with cancer patients in many hospitals, universities, medical schools, and cancer centers around  the country. Clinical trials are a critical step in the improvement of treatment. Before any new treatment can be recommended for general use, doctors conduct studies to find out whether the treatment is both safe for patients and effective against the disease. The results of such studies have led to progress not only in the treatment of cancer, but in the detection, diagnosis, and prevention of the disease as well. Patients interested in taking part in a clinical trial should talk with their caregivers. [place] ([place]): www.cancer.gov Document Released: 06/08/2004 Document Revised: 04/26/2011 Document Reviewed: 01/25/2008 [facility] Patient Information 2015 [place], [place]. This information is not intended to replace advice given to you by your health care provider. Make sure you discuss any questions you have with your health care provider.

## 2013-10-25 NOTE — ED Notes (Signed)
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  Reports recently discharged from [facility] for nephrostomy tube placement.  Recently finished cipro for UTI.  Urine in foley bag is tea colored.  Pt sent to ED because Home Health RN felt like pt's daughter isn't physically able to care for pt and for pain management.  Pt presently denies pain.  Daughter called and reports she is meeting with hospice reps today and will be in ED as soon as she is finished with her meeting.  Pt took roxycodone at 1128.

## 2013-10-26 LAB — URINE CULTURE
COLONY COUNT: NO GROWTH
CULTURE: NO GROWTH

## 2013-11-15 DEATH — deceased

## 2015-03-16 IMAGING — CT CT ABD-PELV W/ CM
2 of 4 series · 15 of 46 positions shown, 17 images · IV contrast (Omnipaque 300)
Comparison: None.

CLINICAL DATA: Chronic urinary tract infection, recurrent symptoms.
Status post appendectomy.

EXAM:
CT ABDOMEN AND PELVIS WITH CONTRAST
TECHNIQUE: Multidetector CT imaging of the abdomen and pelvis was performed
using the standard protocol following bolus administration of
intravenous contrast.
CONTRAST:  50mL OMNIPAQUE IOHEXOL 300 MG/ML SOLN, 100mL OMNIPAQUE
IOHEXOL 300 MG/ML SOLN

[Series 2: abd_pel_with 5.0 b40f · axial · 0.59mm/px · z∈[+584,+964]mm · 12 of 86 slices shown, 14 images]
[im 5/86  soft-tissue]
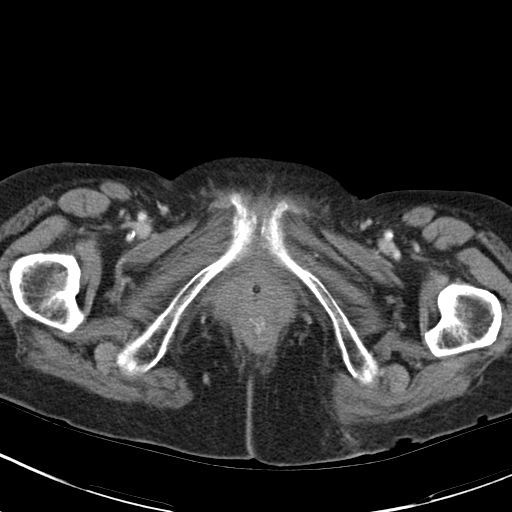
[im 5/86  bone]
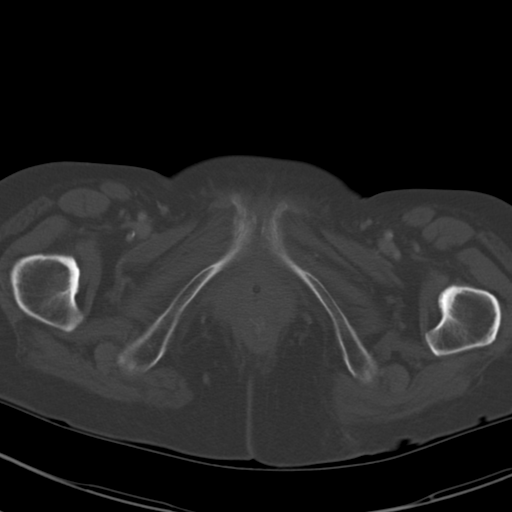
[im 13/86  soft-tissue]
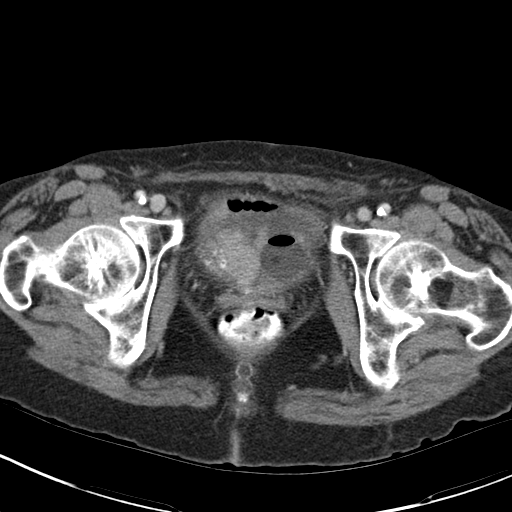
[im 18/86  soft-tissue]
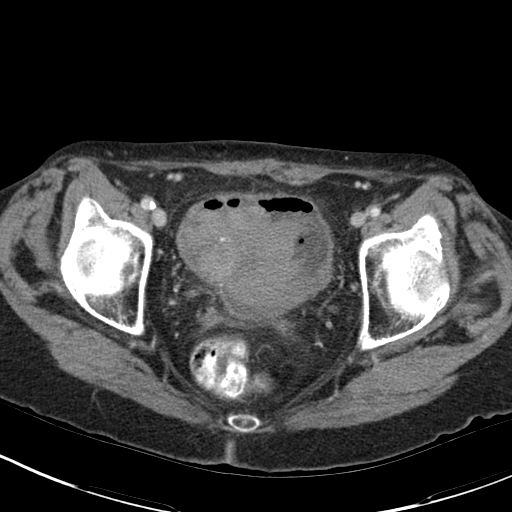
[im 26/86  soft-tissue]
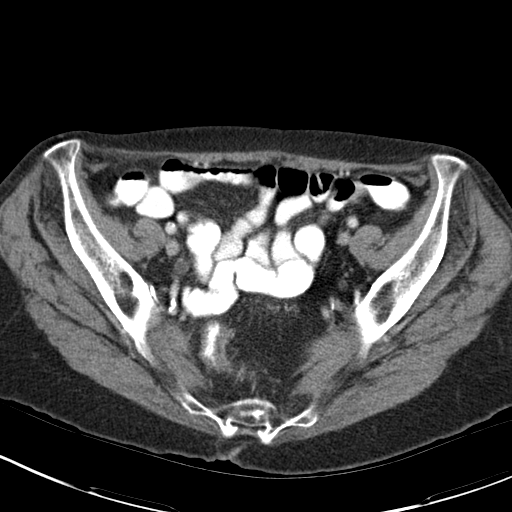
[im 35/86  soft-tissue]
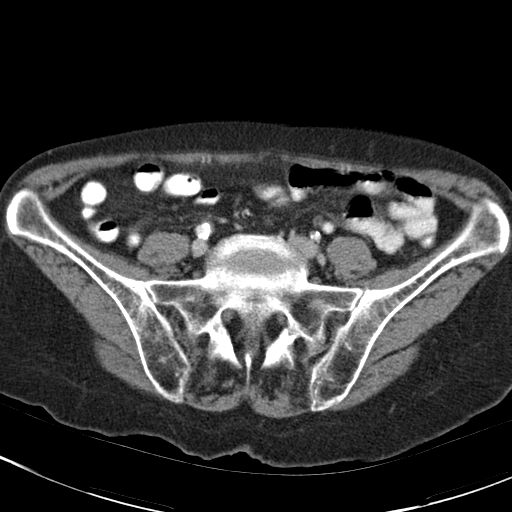
[im 39/86  soft-tissue]
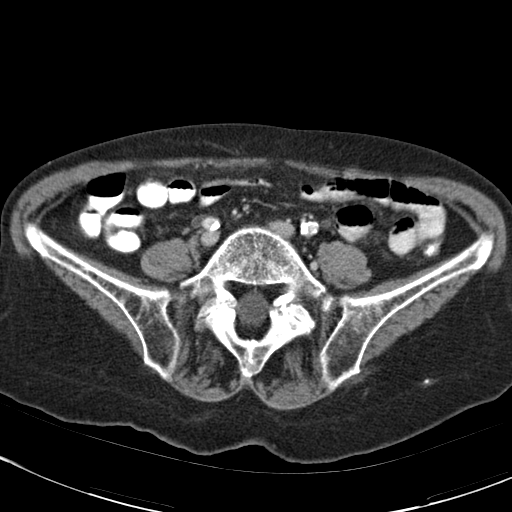
[im 47/86  soft-tissue]
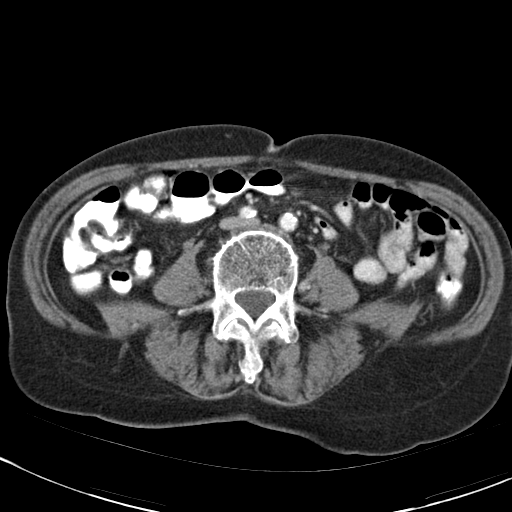
[im 52/86  soft-tissue]
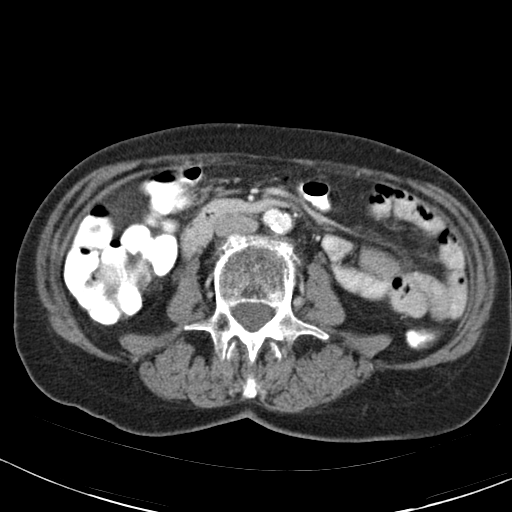
[im 60/86  soft-tissue]
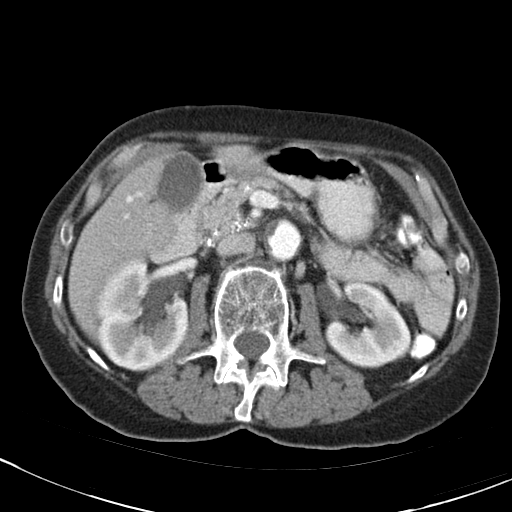
[im 60/86  bone]
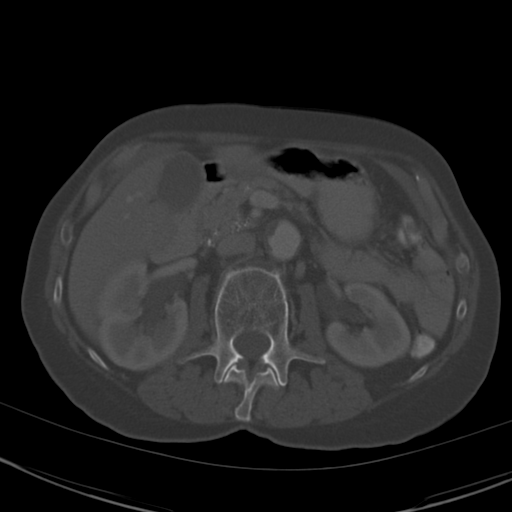
[im 69/86  soft-tissue]
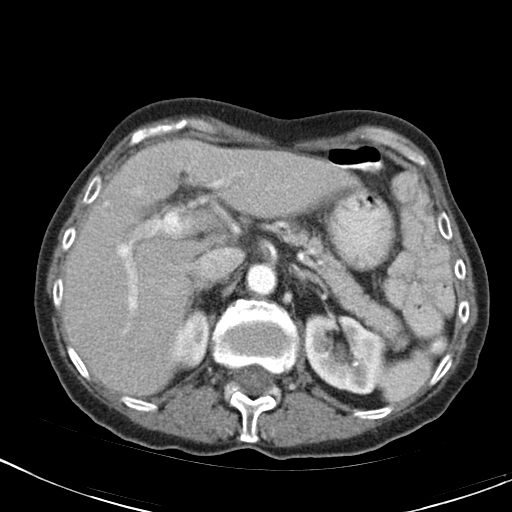
[im 73/86  soft-tissue]
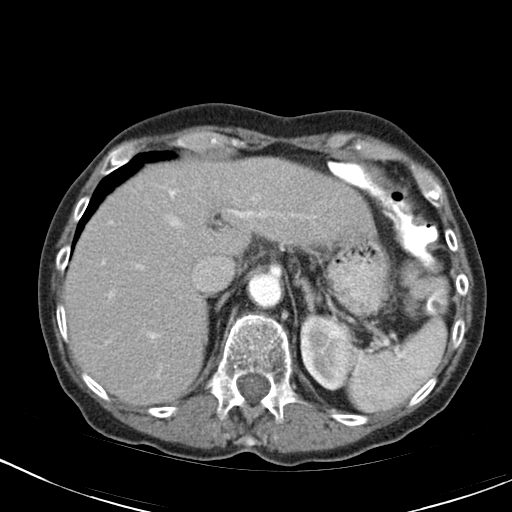
[im 81/86  soft-tissue]
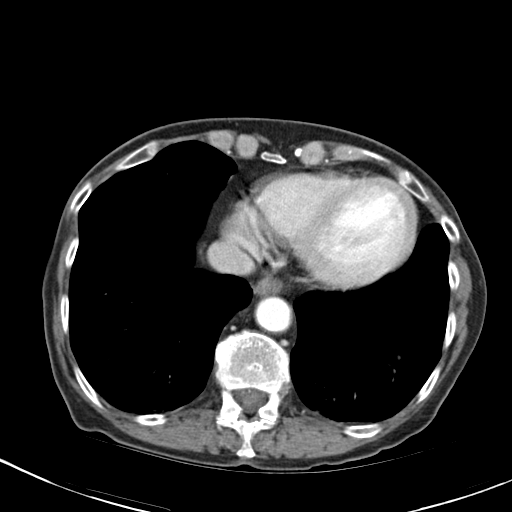

[Series 4: abd_pel_with 3.0 spo cor · coronal · 0.61mm/px · 3 of 78 slices shown]
[im 26/78  soft-tissue]
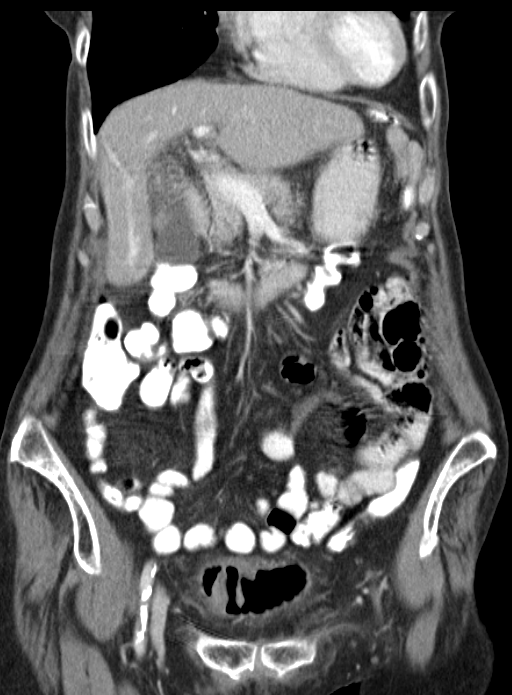
[im 35/78  soft-tissue]
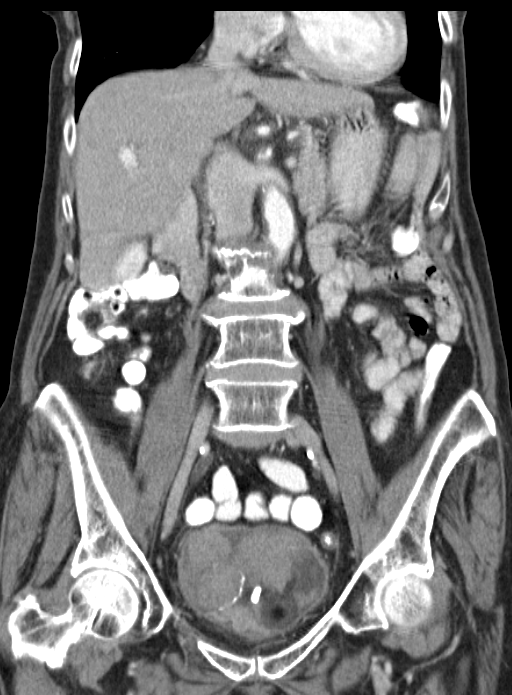
[im 43/78  soft-tissue]
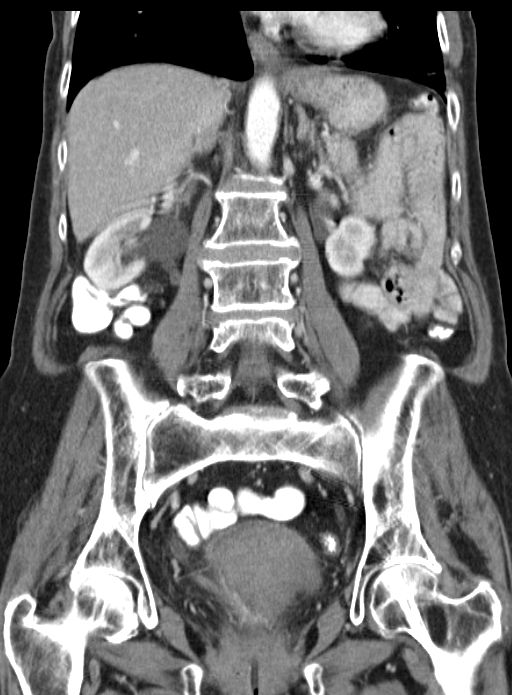

[15 of 46 positions shown; findings below may reference images not displayed]

FINDINGS: Included view of the lung bases demonstrate mild interstitial
prominence suggesting scarring without pleural effusions or focal
consolidations. . Visualized heart and pericardium are unremarkable.

Mild motion degraded examination.

The spleen, gallbladder, pancreas and adrenal glands are
unremarkable. Patchy non expansile hypodensity in the in the left
lobe of the liver likely reflects focal fatty infiltration, the
liver is otherwise unremarkable.

The stomach, small and large bowel are normal in course and caliber
without inflammatory changes. Diverticulosis. Appendix is not
identified, consistent with provided history. No intraperitoneal
free fluid nor free air.

Kidneys are orthotopic, demonstrating symmetric enhancement.
Moderate right mild left hydroureteronephrosis, with Mild uropathy
ileal enhancement. No nephrolithiasis, or renal masses. The
unopacified ureters are normal in course and caliber. Delayed
imaging through the kidneys demonstrates symmetric prompt excretion
to the proximal urinary collecting system. Urinary bladder is well
distended, containing a Foley bulb, air, multiple dense masses,
including a right mural based at least 4 x 2.6 cm mass.

Aortoiliac vessels are normal in course and caliber with moderate to
severe calcific atherosclerosis. Mildly ectatic infrarenal aorta to
2 cm. . No lymphadenopathy by CT size criteria. Internal
reproductive organs are unremarkable. The soft tissues and included
osseous structures are nonsuspicious. Osteopenia. Grade 1 L5-S1
anterolisthesis with chronic bilateral L5 pars interarticularis
defects. Mild chronic appearing L1 compression fracture.
IMPRESSION: Moderate right and mild left hydroureteronephrosis to the level of
the urinary bladder, which contains multiple dense masses, some
which may reflect hematoma though, partially calcified 4 x 2.6 cm
right bladder mass is concerning for neoplasm, recommend cystoscopy.

Diverticulosis without CT findings of acute diverticulitis.

  By: [employer]
